# Patient Record
Sex: Male | Born: 1980 | Race: White | Hispanic: No | Smoking: Former smoker
Health system: Southern US, Community
[De-identification: ages and names within clinical notes are randomized; demographics above are authoritative.]

## PROBLEM LIST (undated history)

## (undated) DIAGNOSIS — I10 Essential (primary) hypertension: Secondary | ICD-10-CM

## (undated) DIAGNOSIS — N200 Calculus of kidney: Secondary | ICD-10-CM

## (undated) HISTORY — PX: KIDNEY STONE SURGERY: SHX686

---

## 2007-05-27 ENCOUNTER — Emergency Department: Payer: Self-pay | Admitting: Unknown Physician Specialty

## 2014-05-02 ENCOUNTER — Emergency Department: Payer: Self-pay | Admitting: Emergency Medicine

## 2014-05-02 LAB — CBC WITH DIFFERENTIAL/PLATELET
Basophil #: 0.1 10*3/uL (ref 0.0–0.1)
Basophil %: 0.6 %
Eosinophil #: 0.3 10*3/uL (ref 0.0–0.7)
Eosinophil %: 1.6 %
HCT: 44.4 % (ref 40.0–52.0)
HGB: 14.7 g/dL (ref 13.0–18.0)
Lymphocyte #: 2.7 10*3/uL (ref 1.0–3.6)
Lymphocyte %: 14.9 %
MCH: 29.6 pg (ref 26.0–34.0)
MCHC: 33.1 g/dL (ref 32.0–36.0)
MCV: 90 fL (ref 80–100)
MONO ABS: 0.9 x10 3/mm (ref 0.2–1.0)
Monocyte %: 4.9 %
Neutrophil #: 14 10*3/uL — ABNORMAL HIGH (ref 1.4–6.5)
Neutrophil %: 78 %
Platelet: 364 10*3/uL (ref 150–440)
RBC: 4.96 10*6/uL (ref 4.40–5.90)
RDW: 13.2 % (ref 11.5–14.5)
WBC: 17.9 10*3/uL — AB (ref 3.8–10.6)

## 2014-05-02 LAB — MONONUCLEOSIS SCREEN: Mono Test: NEGATIVE

## 2014-05-04 LAB — BETA STREP CULTURE(ARMC)

## 2020-01-12 ENCOUNTER — Other Ambulatory Visit: Payer: Self-pay

## 2020-01-12 ENCOUNTER — Emergency Department

## 2020-01-12 ENCOUNTER — Emergency Department
Admission: EM | Admit: 2020-01-12 | Discharge: 2020-01-13 | Attending: Emergency Medicine | Admitting: Emergency Medicine

## 2020-01-12 DIAGNOSIS — N201 Calculus of ureter: Secondary | ICD-10-CM | POA: Diagnosis not present

## 2020-01-12 DIAGNOSIS — R1032 Left lower quadrant pain: Secondary | ICD-10-CM | POA: Insufficient documentation

## 2020-01-12 DIAGNOSIS — R109 Unspecified abdominal pain: Secondary | ICD-10-CM

## 2020-01-12 DIAGNOSIS — R630 Anorexia: Secondary | ICD-10-CM | POA: Diagnosis not present

## 2020-01-12 DIAGNOSIS — N12 Tubulo-interstitial nephritis, not specified as acute or chronic: Secondary | ICD-10-CM | POA: Insufficient documentation

## 2020-01-12 HISTORY — DX: Essential (primary) hypertension: I10

## 2020-01-12 LAB — COMPREHENSIVE METABOLIC PANEL
ALT: 14 U/L (ref 0–44)
AST: 14 U/L — ABNORMAL LOW (ref 15–41)
Albumin: 4 g/dL (ref 3.5–5.0)
Alkaline Phosphatase: 106 U/L (ref 38–126)
Anion gap: 10 (ref 5–15)
BUN: 18 mg/dL (ref 6–20)
CO2: 24 mmol/L (ref 22–32)
Calcium: 9.4 mg/dL (ref 8.9–10.3)
Chloride: 101 mmol/L (ref 98–111)
Creatinine, Ser: 1.24 mg/dL (ref 0.61–1.24)
GFR calc Af Amer: >60 mL/min (ref >60)
GFR calc non Af Amer: >60 mL/min (ref >60)
Glucose, Bld: 78 mg/dL (ref 70–99)
Potassium: 3.6 mmol/L (ref 3.5–5.1)
Sodium: 135 mmol/L (ref 135–145)
Total Bilirubin: 0.5 mg/dL (ref 0.3–1.2)
Total Protein: 7.9 g/dL (ref 6.5–8.1)

## 2020-01-12 LAB — CBC WITH DIFFERENTIAL/PLATELET
Abs Immature Granulocytes: 0.02 10*3/uL (ref 0.00–0.07)
Basophils Absolute: 0.1 10*3/uL (ref 0.0–0.1)
Basophils Relative: 0 %
Eosinophils Absolute: 0.3 10*3/uL (ref 0.0–0.5)
Eosinophils Relative: 2 %
HCT: 40.6 % (ref 39.0–52.0)
Hemoglobin: 13.1 g/dL (ref 13.0–17.0)
Immature Granulocytes: 0 %
Lymphocytes Relative: 23 %
Lymphs Abs: 2.6 10*3/uL (ref 0.7–4.0)
MCH: 26.4 pg (ref 26.0–34.0)
MCHC: 32.3 g/dL (ref 30.0–36.0)
MCV: 81.7 fL (ref 80.0–100.0)
Monocytes Absolute: 0.9 10*3/uL (ref 0.1–1.0)
Monocytes Relative: 8 %
Neutro Abs: 7.5 10*3/uL (ref 1.7–7.7)
Neutrophils Relative %: 67 %
Platelets: 396 10*3/uL (ref 150–400)
RBC: 4.97 MIL/uL (ref 4.22–5.81)
RDW: 16.6 % — ABNORMAL HIGH (ref 11.5–15.5)
WBC: 11.4 10*3/uL — ABNORMAL HIGH (ref 4.0–10.5)
nRBC: 0 % (ref 0.0–0.2)

## 2020-01-12 LAB — LIPASE, BLOOD: Lipase: 24 U/L (ref 11–51)

## 2020-01-12 MED ORDER — KETOROLAC TROMETHAMINE 30 MG/ML IJ SOLN
15.0000 mg | INTRAMUSCULAR | Status: AC
  Administered 2020-01-12: 15 mg via INTRAVENOUS
  Filled 2020-01-12: qty 1

## 2020-01-12 MED ORDER — SODIUM CHLORIDE 0.9 % IV SOLN
1.0000 g | INTRAVENOUS | Status: AC
  Administered 2020-01-13: 1 g via INTRAVENOUS
  Filled 2020-01-12: qty 10

## 2020-01-12 MED ORDER — SODIUM CHLORIDE 0.9 % IV BOLUS
1000.0000 mL | Freq: Once | INTRAVENOUS | Status: AC
  Administered 2020-01-12: 1000 mL via INTRAVENOUS

## 2020-01-12 MED ORDER — ONDANSETRON HCL 4 MG/2ML IJ SOLN
4.0000 mg | Freq: Once | INTRAMUSCULAR | Status: AC
  Administered 2020-01-12: 4 mg via INTRAVENOUS
  Filled 2020-01-12: qty 2

## 2020-01-12 MED ORDER — IOHEXOL 300 MG/ML  SOLN
100.0000 mL | Freq: Once | INTRAMUSCULAR | Status: AC | PRN
  Administered 2020-01-12: 100 mL via INTRAVENOUS

## 2020-01-12 NOTE — ED Provider Notes (Signed)
[facility] Emergency Department Provider Note  ____________________________________________  Time seen: Approximately 10:56 PM  I have reviewed the triage vital signs and the nursing notes.   HISTORY  Chief Complaint Flank Pain    HPI Alec Stout Stout is a 39 y.o. male with a past history of ureterolithiasis and left ureteral stent placement in March 2021 who complains of left flank pain and left lower quadrant abdominal pain worsening over the past few days ever since running out of Bactrim.  Denies fevers or chills or vomiting but does have some decreased appetite.  No diarrhea or constipation.  Pain is constant, no aggravating or alleviating factors, severe.      No past medical history on file.   There are no problems to display for this patient.       Prior to Admission medications   Not on File  None   Allergies Patient has no known allergies.   No family history on file.  Social History Social History   Tobacco Use  . Smoking status: Not on file  Substance Use Topics  . Alcohol use: Not on file  . Drug use: Not on file  No tobacco alcohol or drug use.  Review of Systems  Constitutional:   No fever or chills.  ENT:   No sore throat. No rhinorrhea. Cardiovascular:   No chest pain or syncope. Respiratory:   No dyspnea or cough. Gastrointestinal: Left flank pain and left lower quadrant abdominal pain as above without vomiting and diarrhea.  Musculoskeletal:   Negative for focal pain or swelling All other systems reviewed and are negative except as documented above in ROS and HPI.  ____________________________________________   PHYSICAL EXAM:  VITAL SIGNS: ED Triage Vitals  Enc Vitals Group     BP 01/12/20 2146 (!) 149/99     Pulse Rate 01/12/20 2146 72     Resp 01/12/20 2146 18     Temp 01/12/20 2146 98.1 F (36.7 C)     Temp Source 01/12/20 2146 Oral     SpO2 01/12/20 2146 99 %     Weight 01/12/20 2147 215  lb (97.5 kg)     Height 01/12/20 2147 6\' 3"  (1.905 m)     Head Circumference --      Peak Flow --      Pain Score 01/12/20 2147 10     Pain Loc --      Pain Edu? --      Excl. in GC? --     Vital signs reviewed, nursing assessments reviewed.   Constitutional:   Alert and oriented. Non-toxic appearance. Eyes:   Conjunctivae are normal. EOMI. PERRL. ENT      Head:   Normocephalic and atraumatic.      Nose:   Wearing a mask.      Mouth/Throat:   Wearing a mask.      Neck:   No meningismus. Full ROM. Hematological/Lymphatic/Immunilogical:   No cervical lymphadenopathy. Cardiovascular:   RRR. Symmetric bilateral radial and DP pulses.  No murmurs. Cap refill less than 2 seconds. Respiratory:   Normal respiratory effort without tachypnea/retractions. Breath sounds are clear and equal bilaterally. No wheezes/rales/rhonchi. Gastrointestinal:   Soft with left lower quadrant tenderness. Non distended. There is no CVA tenderness.  No rebound, rigidity, or guarding.  Musculoskeletal:   Normal range of motion in all extremities. No joint effusions.  No lower extremity tenderness.  No edema. Neurologic:   Normal speech and language.  Motor grossly  intact. No acute focal neurologic deficits are appreciated.  Skin:    Skin is warm, dry and intact. No rash noted.  No petechiae, purpura, or bullae.  ____________________________________________    LABS (pertinent positives/negatives) (all labs ordered are listed, but only abnormal results are displayed) Labs Reviewed  COMPREHENSIVE METABOLIC PANEL - Abnormal; Notable for the following components:      Result Value   AST 14 (*)    All other components within normal limits  CBC WITH DIFFERENTIAL/PLATELET - Abnormal; Notable for the following components:   WBC 11.4 (*)    RDW 16.6 (*)    All other components within normal limits  URINE CULTURE  LIPASE, BLOOD  URINALYSIS, COMPLETE (UACMP) WITH MICROSCOPIC    ____________________________________________   EKG    ____________________________________________    RADIOLOGY  No results found.  ____________________________________________   PROCEDURES Procedures  ____________________________________________  DIFFERENTIAL DIAGNOSIS   Ureteral obstruction, pyelonephritis, renal abscess, cystitis, diverticulitis, ureteral stent displacement  CLINICAL IMPRESSION / ASSESSMENT AND PLAN / ED COURSE  Medications ordered in the ED: Medications  sodium chloride 0.9 % bolus 1,000 mL (1,000 mLs Intravenous New Bag/Given 01/12/20 2227)  ondansetron (ZOFRAN) injection 4 mg (4 mg Intravenous Given 01/12/20 2227)  ketorolac (TORADOL) 30 MG/ML injection 15 mg (15 mg Intravenous Given 01/12/20 2227)    Pertinent labs & imaging results that were available during my care of the patient were reviewed by me and considered in my medical decision making (see chart for details).  Alec Stout was evaluated in Emergency Department on 01/12/2020 for the symptoms described in the history of present illness. He was evaluated in the context of the global COVID-19 pandemic, which necessitated consideration that the patient might be at risk for infection with the SARS-CoV-2 virus that causes COVID-19. Institutional protocols and algorithms that pertain to the evaluation of patients at risk for COVID-19 are in a state of rapid change based on information released by regulatory bodies including the CDC and federal and state organizations. These policies and algorithms were followed during the patient's care in the ED.   Patient presents with worsening left flank and left lower quadrant abdominal pain with an indwelling ureteral stent and 12 mm stone in the proximal left ureter that had not been extracted due to loss of follow-up.  Vital signs unremarkable, patient is nontoxic and not septic.  However he is having substantial pain.  Labs unremarkable.  Will need CT  scan of the abdomen and pelvis to further evaluate.  IV fluids for hydration, IV Zofran and IV Toradol for nausea and pain control.      ____________________________________________   FINAL CLINICAL IMPRESSION(S) / ED DIAGNOSES    Final diagnoses:  Left flank pain  Ureterolithiasis     ED Discharge Orders    None      Portions of this note were generated with dragon dictation software. Dictation errors may occur despite best attempts at proofreading.   Carrie Mew, MD 01/12/20 [phone]
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
## 2020-01-13 LAB — URINALYSIS, COMPLETE (UACMP) WITH MICROSCOPIC
Bacteria, UA: NONE SEEN
Bilirubin Urine: NEGATIVE
Glucose, UA: NEGATIVE mg/dL
Ketones, ur: NEGATIVE mg/dL
Nitrite: POSITIVE — AB
Protein, ur: 100 mg/dL — AB
RBC / HPF: >50 RBC/hpf — ABNORMAL HIGH (ref 0–5)
Specific Gravity, Urine: 1.031 — ABNORMAL HIGH (ref 1.005–1.030)
Squamous Epithelial / HPF: NONE SEEN (ref 0–5)
WBC, UA: >50 WBC/hpf — ABNORMAL HIGH (ref 0–5)
pH: 5 (ref 5.0–8.0)

## 2020-01-13 MED ORDER — IBUPROFEN 600 MG PO TABS
ORAL_TABLET | ORAL | 0 refills | Status: DC
Start: 2020-01-13 — End: 2020-04-24

## 2020-01-13 MED ORDER — CEPHALEXIN 500 MG PO CAPS
500.0000 mg | ORAL_CAPSULE | Freq: Four times a day (QID) | ORAL | 0 refills | Status: AC
Start: 2020-01-13 — End: 2020-01-27

## 2020-01-13 NOTE — Discharge Instructions (Addendum)
Your urine appears infected and you have evidence of a kidney infection on your CT scan.  However the ureteral stent is still working and there is no evidence of urinary obstruction at this time.  Please take the full course of antibiotics prescribed and follow-up with your doctor at the next available opportunity.  Return to the emergency department if you develop new or worsening symptoms that concern you.

## 2020-01-13 NOTE — ED Notes (Signed)
EDP to bedside. 

## 2020-01-14 LAB — URINE CULTURE

## 2020-04-23 ENCOUNTER — Other Ambulatory Visit: Payer: Self-pay

## 2020-04-23 ENCOUNTER — Emergency Department

## 2020-04-23 ENCOUNTER — Encounter: Payer: Self-pay | Admitting: Emergency Medicine

## 2020-04-23 DIAGNOSIS — Y998 Other external cause status: Secondary | ICD-10-CM | POA: Diagnosis not present

## 2020-04-23 DIAGNOSIS — Z79899 Other long term (current) drug therapy: Secondary | ICD-10-CM | POA: Diagnosis not present

## 2020-04-23 DIAGNOSIS — Y939 Activity, unspecified: Secondary | ICD-10-CM | POA: Insufficient documentation

## 2020-04-23 DIAGNOSIS — Y92149 Unspecified place in prison as the place of occurrence of the external cause: Secondary | ICD-10-CM | POA: Insufficient documentation

## 2020-04-23 DIAGNOSIS — S01512A Laceration without foreign body of oral cavity, initial encounter: Secondary | ICD-10-CM | POA: Diagnosis not present

## 2020-04-23 DIAGNOSIS — Z87891 Personal history of nicotine dependence: Secondary | ICD-10-CM | POA: Diagnosis not present

## 2020-04-23 DIAGNOSIS — I10 Essential (primary) hypertension: Secondary | ICD-10-CM | POA: Diagnosis not present

## 2020-04-23 DIAGNOSIS — S01511A Laceration without foreign body of lip, initial encounter: Secondary | ICD-10-CM | POA: Diagnosis not present

## 2020-04-23 DIAGNOSIS — S0990XA Unspecified injury of head, initial encounter: Secondary | ICD-10-CM | POA: Diagnosis present
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
## 2020-04-24 ENCOUNTER — Emergency Department
Admission: EM | Admit: 2020-04-24 | Discharge: 2020-04-24 | Disposition: A | Discharge status: Home / Self Care | Attending: Emergency Medicine | Admitting: Emergency Medicine

## 2020-04-24 DIAGNOSIS — S0181XA Laceration without foreign body of other part of head, initial encounter: Secondary | ICD-10-CM

## 2020-04-24 DIAGNOSIS — S0083XA Contusion of other part of head, initial encounter: Secondary | ICD-10-CM

## 2020-04-24 HISTORY — DX: Calculus of kidney: N20.0

## 2020-04-24 MED ORDER — AMOXICILLIN-POT CLAVULANATE 875-125 MG PO TABS
1.0000 | ORAL_TABLET | Freq: Once | ORAL | Status: AC
  Administered 2020-04-24: 1 via ORAL
  Filled 2020-04-24: qty 1

## 2020-04-24 MED ORDER — LIDOCAINE HCL (PF) 1 % IJ SOLN
INTRAMUSCULAR | Status: AC
  Filled 2020-04-24: qty 5

## 2020-04-24 MED ORDER — AMOXICILLIN-POT CLAVULANATE 875-125 MG PO TABS: 1.0000 | ORAL_TABLET | Freq: Two times a day (BID) | ORAL | 0 refills | Status: AC

## 2020-04-24 MED ORDER — IBUPROFEN 800 MG PO TABS
800.0000 mg | ORAL_TABLET | Freq: Three times a day (TID) | ORAL | 0 refills | Status: AC | PRN
Start: 2020-04-24 — End: ?
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
## 2024-06-27 ENCOUNTER — Emergency Department (HOSPITAL_COMMUNITY)
Admission: EM | Admit: 2024-06-27 | Discharge: 2024-06-28 | Disposition: A | Discharge status: Home / Self Care | Attending: Emergency Medicine | Admitting: Emergency Medicine

## 2024-06-27 ENCOUNTER — Encounter (HOSPITAL_COMMUNITY): Payer: Self-pay

## 2024-06-27 ENCOUNTER — Emergency Department (HOSPITAL_COMMUNITY)

## 2024-06-27 ENCOUNTER — Other Ambulatory Visit: Payer: Self-pay

## 2024-06-27 DIAGNOSIS — M5416 Radiculopathy, lumbar region: Secondary | ICD-10-CM | POA: Diagnosis present

## 2024-06-27 DIAGNOSIS — R030 Elevated blood-pressure reading, without diagnosis of hypertension: Secondary | ICD-10-CM

## 2024-06-27 DIAGNOSIS — D72829 Elevated white blood cell count, unspecified: Secondary | ICD-10-CM | POA: Diagnosis not present

## 2024-06-27 DIAGNOSIS — I10 Essential (primary) hypertension: Secondary | ICD-10-CM | POA: Insufficient documentation

## 2024-06-27 LAB — COMPREHENSIVE METABOLIC PANEL WITH GFR
ALT: 25 U/L (ref 0–44)
AST: 28 U/L (ref 15–41)
Albumin: 4.2 g/dL (ref 3.5–5.0)
Alkaline Phosphatase: 115 U/L (ref 38–126)
Anion gap: 12 (ref 5–15)
BUN: 18 mg/dL (ref 6–20)
CO2: 22 mmol/L (ref 22–32)
Calcium: 9.4 mg/dL (ref 8.9–10.3)
Chloride: 103 mmol/L (ref 98–111)
Creatinine, Ser: 1.19 mg/dL (ref 0.61–1.24)
GFR, Estimated: >60 mL/min (ref >60)
Glucose, Bld: 102 mg/dL — ABNORMAL HIGH (ref 70–99)
Potassium: 3.9 mmol/L (ref 3.5–5.1)
Sodium: 137 mmol/L (ref 135–145)
Total Bilirubin: 0.6 mg/dL (ref 0.0–1.2)
Total Protein: 7.7 g/dL (ref 6.5–8.1)

## 2024-06-27 LAB — CBC WITH DIFFERENTIAL/PLATELET
Abs Immature Granulocytes: 0.04 K/uL (ref 0.00–0.07)
Basophils Absolute: 0 K/uL (ref 0.0–0.1)
Basophils Relative: 0 %
Eosinophils Absolute: 0.2 K/uL (ref 0.0–0.5)
Eosinophils Relative: 1 %
HCT: 52.6 % — ABNORMAL HIGH (ref 39.0–52.0)
Hemoglobin: 17.8 g/dL — ABNORMAL HIGH (ref 13.0–17.0)
Immature Granulocytes: 0 %
Lymphocytes Relative: 19 %
Lymphs Abs: 2.1 K/uL (ref 0.7–4.0)
MCH: 29.6 pg (ref 26.0–34.0)
MCHC: 33.8 g/dL (ref 30.0–36.0)
MCV: 87.4 fL (ref 80.0–100.0)
Monocytes Absolute: 0.7 K/uL (ref 0.1–1.0)
Monocytes Relative: 6 %
Neutro Abs: 8.4 K/uL — ABNORMAL HIGH (ref 1.7–7.7)
Neutrophils Relative %: 74 %
Platelets: 345 K/uL (ref 150–400)
RBC: 6.02 MIL/uL — ABNORMAL HIGH (ref 4.22–5.81)
RDW: 12.5 % (ref 11.5–15.5)
WBC: 11.5 K/uL — ABNORMAL HIGH (ref 4.0–10.5)
nRBC: 0 % (ref 0.0–0.2)

## 2024-06-27 LAB — URINALYSIS, ROUTINE W REFLEX MICROSCOPIC
Bilirubin Urine: NEGATIVE
Glucose, UA: NEGATIVE mg/dL
Hgb urine dipstick: NEGATIVE
Ketones, ur: NEGATIVE mg/dL
Leukocytes,Ua: NEGATIVE
Nitrite: NEGATIVE
Protein, ur: NEGATIVE mg/dL
Specific Gravity, Urine: 1.02 (ref 1.005–1.030)
pH: 5 (ref 5.0–8.0)

## 2024-06-27 LAB — I-STAT CG4 LACTIC ACID, ED: Lactic Acid, Venous: 1.8 mmol/L (ref 0.5–1.9)

## 2024-06-27 MED ORDER — GADOBUTROL 1 MMOL/ML IV SOLN
10.0000 mL | Freq: Once | INTRAVENOUS | Status: AC | PRN
  Administered 2024-06-27: 10 mL via INTRAVENOUS

## 2024-06-27 MED ORDER — OXYCODONE-ACETAMINOPHEN 5-325 MG PO TABS
ORAL_TABLET | ORAL | Status: AC
  Filled 2024-06-27: qty 1

## 2024-06-27 MED ORDER — OXYCODONE-ACETAMINOPHEN 5-325 MG PO TABS
1.0000 | ORAL_TABLET | Freq: Once | ORAL | Status: AC
  Administered 2024-06-27: 1 via ORAL

## 2024-06-27 NOTE — ED Triage Notes (Signed)
 Patient stating he had left flank pain/ low back pain that has worked its way down his whole left leg over the past 3 days. He states it felt like a cramp at first but now his whole leg hurts and is numb. He states he is unable to bear weight on it at this time. Denies shob, does have a history of back problems

## 2024-06-27 NOTE — ED Notes (Signed)
 No answer after call out

## 2024-06-27 NOTE — ED Provider Triage Note (Signed)
 Emergency Medicine Provider Triage Evaluation Note  Alec Stout , a 43 y.o. male  was evaluated in triage.  Pt complains of left flank pain that started 3 days ago.  He reports the pain in his leg extends from the left thigh only down to his left calf.  He reports a numb pins and needle sensation in his left lower extremity.  He denies any known injuries to his left lower extremity.  Denies any history of blood clots.  No swelling in his left leg.  He does report some lower back pain.  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  Denies any fever or chills.  Review of Systems  Positive: As above Negative: As above  Physical Exam  BP (!) 164/118   Pulse (!) 108   Temp 99 F (37.2 C)   Resp 18   SpO2 97%  Gen:   Awake, no distress   Resp:  Normal effort  MSK:   Moves extremities without difficulty  Other:  1+ pedal pulse in the left lower extremity.  Foot appropriately warm to touch.  No significant edema in the lower extremities bilaterally.  No significant calf tenderness to palpation in the left lower extremity.  Mild lumbar spine tenderness to palpation  5/5 strength in the bilateral lower extremities.  Patient can feel touch in the left lower extremity, but reports it feels more tingling compared to the right side.  Medical Decision Making  Medically screening exam initiated at 7:40 PM.  Appropriate orders placed.  Alec Stout was informed that the remainder of the evaluation will be completed by another provider, this initial triage assessment does not replace that evaluation, and the importance of remaining in the ED until their evaluation is complete.  Given history of IV drug use and low back pain/leg pain, will obtain MRI lumbar spine.  Also obtain ultrasound of the left lower extremity to rule out blood clot.   Veta Palma, PA-C 06/27/24 1940

## 2024-06-28 MED ORDER — DICLOFENAC SODIUM 75 MG PO TBEC: 75.0000 mg | DELAYED_RELEASE_TABLET | Freq: Two times a day (BID) | ORAL | 0 refills | Status: AC

## 2024-06-28 MED ORDER — KETOROLAC TROMETHAMINE 15 MG/ML IJ SOLN
15.0000 mg | Freq: Once | INTRAMUSCULAR | Status: AC
  Administered 2024-06-28: 15 mg via INTRAVENOUS
  Filled 2024-06-28: qty 1

## 2024-06-28 MED ORDER — METHOCARBAMOL 500 MG PO TABS: 500.0000 mg | ORAL_TABLET | Freq: Two times a day (BID) | ORAL | 0 refills | Status: AC

## 2024-06-28 MED ORDER — METHYLPREDNISOLONE ACETATE 40 MG/ML IJ SUSP
40.0000 mg | Freq: Once | INTRAMUSCULAR | Status: AC
  Administered 2024-06-28: 40 mg via INTRAMUSCULAR
  Filled 2024-06-28: qty 1

## 2024-06-28 MED ORDER — LIDOCAINE 5 % EX PTCH
1.0000 | MEDICATED_PATCH | CUTANEOUS | Status: DC
  Administered 2024-06-28: 1 via TRANSDERMAL
  Filled 2024-06-28: qty 1

## 2024-06-28 MED ORDER — PREDNISONE 10 MG (21) PO TBPK
ORAL_TABLET | Freq: Every day | ORAL | 0 refills | Status: AC
Start: 2024-06-28 — End: ?

## 2024-06-28 NOTE — ED Notes (Signed)
 Pt verbalized understanding of discharge instructions. Pt ambulated from ed.

## 2024-06-28 NOTE — Discharge Instructions (Addendum)
 Please follow-up with neurosurgery, call to schedule an appointment.  MRI today recommends repeat MRI in 8 to 12 weeks.  This will need to be done outpatient.  Monitor your blood pressure.  Record results and follow-up with primary care provider.  If you do not have a primary care provider, please contact [place] and wellness.  Take prednisone as prescribed and complete the full course. Take Robaxin as needed as prescribed, do not drive or operate machinery while taking this medication. Take diclofenac as prescribed for pain.  Do not take other NSAID type medications.

## 2024-06-28 NOTE — ED Provider Notes (Addendum)
 [place] EMERGENCY DEPARTMENT AT [facility] Provider Note   CSN: [id number] Arrival date & time: 06/27/24  1800     Patient presents with: Leg Pain and Flank Pain   Alec Stout is a 43 y.o. male.   43 yo male presents with complaint of a cramping pain in his left thigh onset 4 days ago, radiating down to the left leg. Pain is worse with weight-bearing, does not improve with rest/elevating leg. No saddle paresthesias, loss of bowel or bladder control, no leg weakness. 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        Prior to Admission medications   Medication Sig Start Date End Date Taking? Authorizing Provider  diclofenac (VOLTAREN) 75 MG EC tablet Take 1 tablet (75 mg total) by mouth 2 (two) times daily for 10 days. 06/28/24 07/08/24 Yes Beverley Leita LABOR, PA-C  methocarbamol (ROBAXIN) 500 MG tablet Take 1 tablet (500 mg total) by mouth 2 (two) times daily. 06/28/24  Yes Beverley Leita LABOR, PA-C  predniSONE (STERAPRED UNI-PAK 21 TAB) 10 MG (21) TBPK tablet Take by mouth daily. Take 6 tabs by mouth daily  for 2 days, then 5 tabs for 2 days, then 4 tabs for 2 days, then 3 tabs for 2 days, 2 tabs for 2 days, then 1 tab by mouth daily for 2 days 06/28/24  Yes Beverley Leita LABOR, PA-C  ibuprofen  (ADVIL ) 800 MG tablet Take 1 tablet (800 mg total) by mouth every 8 (eight) hours as needed. 04/24/20   Delores Raford SAILOR, MD    Allergies: Sulfa antibiotics    Review of Systems Negative except as per HPI Updated Vital Signs BP (!) 157/128   Pulse 85   Temp 97.7 F (36.5 C)   Resp 18   SpO2 100%   Physical Exam Vitals and nursing note reviewed.  Constitutional:      General: He is not in acute distress.    Appearance: Normal appearance. He is well-developed. He is not diaphoretic.  HENT:     Head: Normocephalic and atraumatic.  Cardiovascular:     Pulses: Normal pulses.  Musculoskeletal:        General: Tenderness present. No swelling.     Cervical back: Neck supple. No  tenderness or bony tenderness. No pain with movement.     Thoracic back: No tenderness or bony tenderness.     Lumbar back: Tenderness present. No bony tenderness. Negative right straight leg raise test and negative left straight leg raise test.       Back:     Right lower leg: No edema.     Left lower leg: No edema.     Comments: No palpable tenderness with palpation/ROM of the left lower extremity. Strong DP pulse present. Leg strength symmetric. Notes pins and needles sensation to lateral left lower leg.   Skin:    General: Skin is warm and dry.     Findings: No erythema or rash.  Neurological:     Mental Status: He is alert and oriented to person, place, and time.     Motor: No weakness.     Gait: Gait normal.  Psychiatric:        Behavior: Behavior normal.     (all labs ordered are listed, but only abnormal results are displayed) Labs Reviewed  CBC WITH DIFFERENTIAL/PLATELET - Abnormal; Notable for the following components:      Result Value   WBC 11.5 (*)    RBC 6.02 (*)  Hemoglobin 17.8 (*)    HCT 52.6 (*)    Neutro Abs 8.4 (*)    All other components within normal limits  COMPREHENSIVE METABOLIC PANEL WITH GFR - Abnormal; Notable for the following components:   Glucose, Bld 102 (*)    All other components within normal limits  CULTURE, BLOOD (ROUTINE X 2)  CULTURE, BLOOD (ROUTINE X 2)  URINALYSIS, ROUTINE W REFLEX MICROSCOPIC  I-STAT CG4 LACTIC ACID, ED    EKG: None  Radiology: MR Lumbar Spine W Wo Contrast Result Date: 06/27/2024 EXAM: MR Lumbar Spine with and without intravenous contrast. 06/27/2024 09:22:00 PM TECHNIQUE: Multiplanar multisequence MRI of the lumbar spine was performed with and without the administration of 10 mL gadobutrol (GADAVIST) 1 MMOL/ML injection. COMPARISON: None available CLINICAL HISTORY: Low back pain and numbness in L leg, history of IVDU. FINDINGS: BONES AND ALIGNMENT: Normal vertebral body heights. Normal bone marrow signal. No  abnormal enhancement. Normal alignment. L5-S1: Right L5 pars interarticularis defect. SPINAL CORD: The conus terminates normally. SOFT TISSUES: No acute abnormality. L1-L2: No disc herniation. No spinal canal stenosis or neural foraminal narrowing. L2-L3: No disc herniation. No spinal canal stenosis or neural foraminal narrowing. L3-L4: Small disc bulge. No central spinal canal or neural foraminal stenosis. L4-L5: Small disc bulge with superimposed small central disc protrusion. Moderate left foraminal stenosis. L5-S1: Small disc bulge. No central spinal canal stenosis or neural foraminal narrowing. The left S1 nerve root is enlarged proximally. IMPRESSION: 1. Enlarged proximal left S1 nerve root. This is adjacent to the bulging L5-S1 disc and could indicate reactive changes. A non-enhancing nerve sheath tumor is also a possibility. Consider follow-up MRI in 8-12 weeks. 2. L4-L5 small disc bulge with superimposed small central disc protrusion causing moderate left foraminal stenosis. 3. L5-S1 right L5 pars interarticularis defect and small disc bulge. Electronically signed by: Franky Stanford MD 06/27/2024 09:50 PM EDT RP Workstation: [id number]     Procedures   Medications Ordered in the ED  lidocaine  (LIDODERM ) 5 % 1 patch (1 patch Transdermal Patch Applied 06/28/24 0216)  oxyCODONE-acetaminophen (PERCOCET/ROXICET) 5-325 MG per tablet 1 tablet (1 tablet Oral Given 06/27/24 2001)  gadobutrol (GADAVIST) 1 MMOL/ML injection 10 mL (10 mLs Intravenous Contrast Given 06/27/24 2053)  methylPREDNISolone acetate (DEPO-MEDROL) injection 40 mg (40 mg Intramuscular Given 06/28/24 0257)  ketorolac  (TORADOL ) 15 MG/ML injection 15 mg (15 mg Intravenous Given 06/28/24 0216)                                    Medical Decision Making Amount and/or Complexity of Data Reviewed Labs: ordered.  Risk Prescription drug management.   This patient presents to the ED for concern of left lower back pain, left leg pain,  this involves an extensive number of treatment options, and is a complaint that carries with it a high risk of complications and morbidity.  The differential diagnosis includes but not limited to epidural abscess, herniated disc, muscle spasm   Co morbidities / Chronic conditions that complicate the patient evaluation  Hypertension, kidney stone, substance abuse   Additional history obtained:  Additional history obtained from EMR External records from outside source obtained and reviewed including prior labs and imaging on file   Lab Tests:  I Ordered, and personally interpreted labs.  The pertinent results include: CBC with mild ketosis at 11.5.  CMP without significant findings.  Urine negative.  Lactic acid negative at 1.8.  Blood cultures  collected in triage and in process.   Imaging Studies ordered:  I ordered imaging studies including MRI lumbar spine I independently visualized and interpreted imaging which showed no obvious cord compression  I agree with the radiologist interpretation   Problem List / ED Course / Critical interventions / Medication management  43 year old male with history of IVDA presents with complaint of pain in his left lower back as well as left leg.  Gait intact, does have report of pins and needle sensation to lateral aspect of lower leg.  Strong DP pulse present.  Equal leg strength.  MRI without emergent findings.  Did discuss incidental finding of possible nerve sheath tumor and referred to neurosurgery for follow-up.  Provided with steroids, Lidoderm , NSAIDs in the ER with prescription for steroids, NSAID, muscle relaxant sent to patient's pharmacy.  Recommend follow-up. I ordered medication including Lidoderm , Depo-Medrol, Toradol .  Previously provided with Percocet in triage Reevaluation of the patient after these medicines showed that the patient stable I have reviewed the patients home medicines and have made adjustments as  needed   Consultations Obtained:  I requested consultation with the ER attending, Dr. Griselda,  and discussed lab and imaging findings as well as pertinent plan - they recommend: agree with plan for outpatient follow up   Social Determinants of Health:  No PCP on file, referred to [facility] wellness for follow-up.   Test / Admission - Considered:  Stable for discharge      Final diagnoses:  Lumbar radiculopathy, acute  Elevated blood pressure reading    ED Discharge Orders          Ordered    methocarbamol (ROBAXIN) 500 MG tablet  2 times daily        06/28/24 0219    predniSONE (STERAPRED UNI-PAK 21 TAB) 10 MG (21) TBPK tablet  Daily        06/28/24 0219    diclofenac (VOLTAREN) 75 MG EC tablet  2 times daily        06/28/24 0219               Beverley Leita LABOR, PA-C 06/28/24 0259    Beverley Leita LABOR, PA-C 06/28/24 KAROL Griselda Norris, MD 06/28/24 2241

## 2024-07-02 LAB — CULTURE, BLOOD (ROUTINE X 2)
Culture: NO GROWTH
Culture: NO GROWTH
Special Requests: ADEQUATE
Special Requests: ADEQUATE
